# Patient Record
Sex: Male | Born: 1945 | Race: White | Hispanic: No | Marital: Married | Smoking: Former smoker
Health system: Southern US, Community
[De-identification: ages and names within clinical notes are randomized; demographics above are authoritative.]

## PROBLEM LIST (undated history)

## (undated) DIAGNOSIS — K219 Gastro-esophageal reflux disease without esophagitis: Secondary | ICD-10-CM

## (undated) DIAGNOSIS — E785 Hyperlipidemia, unspecified: Secondary | ICD-10-CM

## (undated) DIAGNOSIS — I10 Essential (primary) hypertension: Secondary | ICD-10-CM

## (undated) DIAGNOSIS — M1990 Unspecified osteoarthritis, unspecified site: Secondary | ICD-10-CM

## (undated) DIAGNOSIS — K5792 Diverticulitis of intestine, part unspecified, without perforation or abscess without bleeding: Secondary | ICD-10-CM

## (undated) HISTORY — DX: Hyperlipidemia, unspecified: E78.5

## (undated) HISTORY — DX: Essential (primary) hypertension: I10

## (undated) HISTORY — DX: Diverticulitis of intestine, part unspecified, without perforation or abscess without bleeding: K57.92

## (undated) HISTORY — DX: Unspecified osteoarthritis, unspecified site: M19.90

## (undated) HISTORY — DX: Gastro-esophageal reflux disease without esophagitis: K21.9

---

## 1994-02-28 HISTORY — PX: ROTATOR CUFF REPAIR: SHX139

## 2008-02-29 HISTORY — PX: KNEE ARTHROSCOPY W/ MENISCAL REPAIR: SHX1877

## 2013-02-28 HISTORY — PX: PARATHYROIDECTOMY: SHX19

## 2014-02-28 HISTORY — PX: HERNIA REPAIR: SHX51

## 2014-02-28 HISTORY — PX: NISSEN FUNDOPLICATION: SHX2091

## 2015-04-14 ENCOUNTER — Ambulatory Visit (INDEPENDENT_AMBULATORY_CARE_PROVIDER_SITE_OTHER): Payer: 59 | Admitting: Family Medicine

## 2015-04-14 ENCOUNTER — Encounter: Payer: Self-pay | Admitting: Family Medicine

## 2015-04-14 VITALS — BP 144/88 | HR 66 | Temp 98.1°F | Ht 71.0 in | Wt 200.4 lb

## 2015-04-14 DIAGNOSIS — I10 Essential (primary) hypertension: Secondary | ICD-10-CM | POA: Diagnosis not present

## 2015-04-14 DIAGNOSIS — K219 Gastro-esophageal reflux disease without esophagitis: Secondary | ICD-10-CM | POA: Diagnosis not present

## 2015-04-14 DIAGNOSIS — N400 Enlarged prostate without lower urinary tract symptoms: Secondary | ICD-10-CM

## 2015-04-14 DIAGNOSIS — R030 Elevated blood-pressure reading, without diagnosis of hypertension: Secondary | ICD-10-CM | POA: Insufficient documentation

## 2015-04-14 NOTE — Progress Notes (Signed)
Patient ID: David Glover, male   DOB: 08/18/1945, 70 y.o.   MRN: [id number]  Tommi Rumps, MD Phone: [phone]  David Glover is a 70 y.o. male who presents today for new patient visit.  HYPERTENSION Disease Monitoring Home BP Monitoring intermittently checks, typically 120s over 80s Chest pain- no    Dyspnea- no Currently diet and exercise controlled Lightheadedness-  no  Edema- no  GERD: This issue is resolved since having Nissen fundoplication and hiatal hernia repair. No reflux symptoms. No abdominal pain. Not currently on any medications.  BPH: Patient notes in the past he's been advised that he has a slightly enlarged mildly nodular prostate. He notes some BPH symptoms. He gets up 2 times per night to urinate. 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 He notes when he squeezes to urinate he gets some urine in the diverticula and about an hour later he gets a little bit more out of his bladder. He has been evaluated by urologist for past and they recommended surgery, the patient did not want to proceed with this. He is not on any medications for this issue.   Active Ambulatory Problems    Diagnosis Date Noted  . Essential hypertension 04/14/2015  . BPH (benign prostatic hyperplasia) 04/14/2015  . GERD (gastroesophageal reflux disease) 04/14/2015   Resolved Ambulatory Problems    Diagnosis Date Noted  . No Resolved Ambulatory Problems   Past Medical History  Diagnosis Date  . Arthritis   . Diverticulitis   . Hypertension   . Hyperlipidemia     Family History  Problem Relation Age of Onset  . Alcoholism    . Arthritis    . Lung cancer    . Heart disease      Social History   Social History  . Marital Status: Married    Spouse Name: N/A  . Number of Children: N/A  . Years of Education: N/A   Occupational History  . Not on file.   Social History Main Topics  . Smoking status:  Former Research scientist (life sciences)  . Smokeless tobacco: Not on file  . Alcohol Use: No  . Drug Use: No  . Sexual Activity: Not on file   Other Topics Concern  . Not on file   Social History Narrative  . No narrative on file    ROS   General:  Negative for nexplained weight loss, fever Skin: Negative for new or changing mole, sore that won't heal HEENT: Negative for trouble hearing, trouble seeing, ringing in ears, mouth sores, hoarseness, change in voice, dysphagia. CV:  Negative for chest pain, dyspnea, edema, palpitations Resp: Negative for cough, dyspnea, hemoptysis GI: Negative for nausea, vomiting, diarrhea, constipation, abdominal pain, melena, hematochezia. GU: Positive for frequent urination and difficulty keeping strain, Negative for dysuria, incontinence, urinary hesitance, hematuria, vaginal or penile discharge, polyuria, sexual difficulty, lumps in testicle or breasts MSK: Negative for muscle cramps or aches, joint pain or swelling Neuro: Negative for headaches, weakness, numbness, dizziness, passing out/fainting Psych: Negative for depression, anxiety, memory problems  Objective  Physical Exam Filed Vitals:   04/14/15 1006 04/14/15 1215  BP: 144/88 144/88  Pulse: 66   Temp: 98.1 F (36.7 C)     BP Readings from Last 3 Encounters:  04/14/15 144/88   Wt Readings from Last 3 Encounters:  04/14/15 200 lb 6.4 oz (90.901 kg)    Physical Exam  Constitutional: He is well-developed,  well-nourished, and in no distress.  HENT:  Head: Normocephalic and atraumatic.  Right Ear: External ear normal.  Left Ear: External ear normal.  Mouth/Throat: Oropharynx is clear and moist. No oropharyngeal exudate.  Eyes: Conjunctivae are normal. Pupils are equal, round, and reactive to light.  Neck: Neck supple.  Cardiovascular: Normal rate, regular rhythm and normal heart sounds.   Pulmonary/Chest: Effort normal and breath sounds normal.  Abdominal: Soft. Bowel sounds are normal. He exhibits no  distension. There is no tenderness. There is no rebound and no guarding.  Genitourinary: Rectum normal and penis normal.  Normal scrotum, normal testicles, normal epididymis, normal vas deferens, prostate mildly enlarged with no nodules or tenderness  Musculoskeletal: He exhibits no edema.  Lymphadenopathy:    He has no cervical adenopathy.  Neurological: He is alert. Gait normal.  Skin: Skin is warm and dry. He is not diaphoretic.  Psychiatric: Mood normal.     Assessment/Plan:   Essential hypertension Blood pressure slightly above goal today. Patient reports on checks at home it is normal. He is asymptomatic. Discussed having him check it more consistently at home and letting us know if it is consistently 140/90 or above. Patient voiced his preference for not being on medications for this. We will continue to monitor. Reports he had lab work done in the last 6 months and will bring this in for [place] to review.  BPH (benign prostatic hyperplasia) Stable. IPSS score of 16 with mostly satisfied. Discussed possible medications for this, though he declines medications stating that this is not that bothersome for him. He will continue to monitor.  GERD (gastroesophageal reflux disease) Asymptomatic since recent surgeries. We'll continue to monitor for recurrence.    Tommi Rumps

## 2015-04-14 NOTE — Progress Notes (Signed)
Pre visit review using our clinic review tool, if applicable. No additional management support is needed unless otherwise documented below in the visit note. 

## 2015-04-14 NOTE — Assessment & Plan Note (Addendum)
Blood pressure slightly above goal today. Patient reports on checks at home it is normal. He is asymptomatic. Discussed having him check it more consistently at home and letting us know if it is consistently 140/90 or above. Patient voiced his preference for not being on medications for this. We will continue to monitor. Reports he had lab work done in the last 6 months and will bring this in for [place] to review.

## 2015-04-14 NOTE — Patient Instructions (Signed)
Nice to meet you. Please continue to monitor your blood pressure at home. If it starts to run greater than 140/90 please let us know. Please continue to monitor your urinary symptoms. They worsen please let us know.

## 2015-04-14 NOTE — Assessment & Plan Note (Signed)
Stable. IPSS score of 16 with mostly satisfied. Discussed possible medications for this, though he declines medications stating that this is not that bothersome for him. He will continue to monitor.

## 2015-04-14 NOTE — Assessment & Plan Note (Signed)
Asymptomatic since recent surgeries. We'll continue to monitor for recurrence.

## 2015-05-12 ENCOUNTER — Encounter: Payer: Self-pay | Admitting: Family Medicine

## 2015-05-12 ENCOUNTER — Ambulatory Visit (INDEPENDENT_AMBULATORY_CARE_PROVIDER_SITE_OTHER): Payer: 59 | Admitting: Family Medicine

## 2015-05-12 VITALS — BP 122/76 | HR 71 | Temp 97.9°F | Ht 71.0 in | Wt 203.8 lb

## 2015-05-12 DIAGNOSIS — I10 Essential (primary) hypertension: Secondary | ICD-10-CM

## 2015-05-12 NOTE — Patient Instructions (Signed)
Nice to see you. Your blood pressure is improved today. Please continued diet and exercise as previously discussed. We will see back in 3 months for lab work and evaluation.

## 2015-05-12 NOTE — Progress Notes (Signed)
Patient ID: David Glover, male   DOB: 1945/03/11, 70 y.o.   MRN: [id number]  Tommi Rumps, MD Phone: [phone]  David Glover is a 70 y.o. male who presents today for follow-up.  Elevated BP: Patient notes he has been checking it. It is been widely variable. 115-176/62-101. Notes it can vary that much within minutes of each other. He notes it has not consistently been any particular value. No chest pain, shortness of breath, or edema. They're eating more at home. They also joined tennis club or going to start playing tennis again.  PMH: Former smoker.   ROS see history of present illness  Objective  Physical Exam Filed Vitals:   05/12/15 0930  BP: 122/76  Pulse: 71  Temp: 97.9 F (36.6 C)    BP Readings from Last 3 Encounters:  05/12/15 122/76  04/14/15 144/88   Wt Readings from Last 3 Encounters:  05/12/15 203 lb 12.8 oz (92.443 kg)  04/14/15 200 lb 6.4 oz (90.901 kg)    Physical Exam  Constitutional: He is well-developed, well-nourished, and in no distress.  HENT:  Head: Normocephalic.  Cardiovascular: Normal rate and regular rhythm.  Exam reveals no gallop and no friction rub.   No murmur heard. Pulmonary/Chest: Effort normal and breath sounds normal. No respiratory distress. He has no wheezes. He has no rales.  Neurological: He is alert. Gait normal.  Skin: Skin is warm and dry. He is not diaphoretic.     Assessment/Plan: Please see individual problem list.  Essential hypertension Blood pressure at goal today. He is asymptomatic. Sounds as though his blood pressure cuff is widely variable and he has stopped checking with this. Notes the blood pressure cuff was equally widely variable with his wife as well. Suspect blood pressure is in the normal range. He will continue to work on diet and exercise. We will continue to monitor. Follow-up in 3 months.    Tommi Rumps, MD [place]

## 2015-05-12 NOTE — Assessment & Plan Note (Signed)
Blood pressure at goal today. He is asymptomatic. Sounds as though his blood pressure cuff is widely variable and he has stopped checking with this. Notes the blood pressure cuff was equally widely variable with his wife as well. Suspect blood pressure is in the normal range. He will continue to work on diet and exercise. We will continue to monitor. Follow-up in 3 months.

## 2015-05-12 NOTE — Progress Notes (Signed)
Pre visit review using our clinic review tool, if applicable. No additional management support is needed unless otherwise documented below in the visit note. 

## 2015-08-11 ENCOUNTER — Ambulatory Visit (INDEPENDENT_AMBULATORY_CARE_PROVIDER_SITE_OTHER): Payer: 59 | Admitting: Family Medicine

## 2015-08-11 ENCOUNTER — Encounter: Payer: Self-pay | Admitting: Family Medicine

## 2015-08-11 VITALS — BP 118/76 | HR 69 | Temp 98.1°F | Ht 71.0 in | Wt 203.0 lb

## 2015-08-11 DIAGNOSIS — R0781 Pleurodynia: Secondary | ICD-10-CM | POA: Diagnosis not present

## 2015-08-11 DIAGNOSIS — R030 Elevated blood-pressure reading, without diagnosis of hypertension: Secondary | ICD-10-CM | POA: Diagnosis not present

## 2015-08-11 DIAGNOSIS — IMO0001 Reserved for inherently not codable concepts without codable children: Secondary | ICD-10-CM

## 2015-08-11 NOTE — Patient Instructions (Signed)
Nice to see you.  Your blood pressure is fantastic. Please continue to keep an eye on your ribs. If you develop chest pain, shortness of breath, or worsening rib pain please seek medical attention. I will see back in 6 months for physical.

## 2015-08-11 NOTE — Assessment & Plan Note (Signed)
Patient with a history of elevated blood pressure in the past. Has been in the normal range recently. Asymptomatic. We'll continue to monitor. He is continuing to work on diet and exercise. Continue diet and exercise.

## 2015-08-11 NOTE — Progress Notes (Signed)
Pre visit review using our clinic review tool, if applicable. No additional management support is needed unless otherwise documented below in the visit note. 

## 2015-08-11 NOTE — Assessment & Plan Note (Signed)
Patient with injury to his left ribs several months ago. Has been progressively improving. Benign exam today. Normal lung exam. Continue to monitor. If worsens he will let us know.

## 2015-08-11 NOTE — Progress Notes (Signed)
Patient ID: David Glover, male   DOB: 1945/11/01, 70 y.o.   MRN: [id number]  Tommi Rumps, MD Phone: [phone]  David Glover is a 70 y.o. male who presents today for follow-up.  Elevated BP: Patient with a history of elevated blood pressure. Is not on any medications for this. Has been normal the last several checks. No chest pain or shortness of breath. No lower extremity swelling. Has been more active with the house they are working on and with tenderness. They have been eating in more frequently than previously and he has decreased his salt intake.  Rib injury: Several months ago he notes he slipped and hit the left side of his ribs on cross bar. Had bruising. Had pain in this area. States he possibly cracked a rib. Did not get evaluated as it continued to improve and he has been breathing okay. Infrequently gets a minimal discomfort when he turns the wrong way at night and this area though otherwise feels back to normal.  PMH: Former smoker   ROS see history of present illness  Objective  Physical Exam Filed Vitals:   08/11/15 0957  BP: 118/76  Pulse: 69  Temp: 98.1 F (36.7 C)    BP Readings from Last 3 Encounters:  08/11/15 118/76  05/12/15 122/76  04/14/15 144/88   Wt Readings from Last 3 Encounters:  08/11/15 203 lb (92.08 kg)  05/12/15 203 lb 12.8 oz (92.443 kg)  04/14/15 200 lb 6.4 oz (90.901 kg)    Physical Exam  Constitutional: He is well-developed, well-nourished, and in no distress.  HENT:  Head: Normocephalic and atraumatic.  Right Ear: External ear normal.  Left Ear: External ear normal.  Cardiovascular: Normal rate, regular rhythm and normal heart sounds.   Pulmonary/Chest: Effort normal and breath sounds normal.  Musculoskeletal:  No tenderness of the left lateral ribs, no bony defects of the left lateral ribs, no flail chest  Neurological: He is alert. Gait normal.  Skin: He is not diaphoretic.     Assessment/Plan:  Please see individual problem list.  Elevated blood pressure Patient with a history of elevated blood pressure in the past. Has been in the normal range recently. Asymptomatic. We'll continue to monitor. He is continuing to work on diet and exercise. Continue diet and exercise.  Rib pain on left side Patient with injury to his left ribs several months ago. Has been progressively improving. Benign exam today. Normal lung exam. Continue to monitor. If worsens he will let us know.    Tommi Rumps, MD [place]

## 2016-02-11 ENCOUNTER — Encounter: Payer: Self-pay | Admitting: Family Medicine

## 2016-02-11 ENCOUNTER — Ambulatory Visit (INDEPENDENT_AMBULATORY_CARE_PROVIDER_SITE_OTHER): Payer: Medicare Other | Admitting: Family Medicine

## 2016-02-11 VITALS — BP 140/90 | HR 75 | Temp 97.9°F | Ht 71.5 in | Wt 210.0 lb

## 2016-02-11 DIAGNOSIS — M2550 Pain in unspecified joint: Secondary | ICD-10-CM

## 2016-02-11 DIAGNOSIS — Z13220 Encounter for screening for lipoid disorders: Secondary | ICD-10-CM

## 2016-02-11 DIAGNOSIS — Z0001 Encounter for general adult medical examination with abnormal findings: Secondary | ICD-10-CM | POA: Insufficient documentation

## 2016-02-11 DIAGNOSIS — R030 Elevated blood-pressure reading, without diagnosis of hypertension: Secondary | ICD-10-CM

## 2016-02-11 DIAGNOSIS — Z1329 Encounter for screening for other suspected endocrine disorder: Secondary | ICD-10-CM

## 2016-02-11 DIAGNOSIS — K111 Hypertrophy of salivary gland: Secondary | ICD-10-CM | POA: Diagnosis not present

## 2016-02-11 DIAGNOSIS — Z125 Encounter for screening for malignant neoplasm of prostate: Secondary | ICD-10-CM

## 2016-02-11 LAB — LIPID PANEL
Cholesterol: 222 mg/dL — ABNORMAL HIGH (ref 0–200)
HDL: 55 mg/dL (ref >39.00)
LDL Cholesterol: 144 mg/dL — ABNORMAL HIGH (ref 0–99)
NonHDL: 166.52
Total CHOL/HDL Ratio: 4
Triglycerides: 112 mg/dL (ref 0.0–149.0)
VLDL: 22.4 mg/dL (ref 0.0–40.0)

## 2016-02-11 LAB — COMPREHENSIVE METABOLIC PANEL
ALT: 28 U/L (ref 0–53)
AST: 19 U/L (ref 0–37)
Albumin: 4.3 g/dL (ref 3.5–5.2)
Alkaline Phosphatase: 57 U/L (ref 39–117)
BILIRUBIN TOTAL: 1.6 mg/dL — AB (ref 0.2–1.2)
BUN: 17 mg/dL (ref 6–23)
CO2: 31 meq/L (ref 19–32)
CREATININE: 1.17 mg/dL (ref 0.40–1.50)
Calcium: 9.3 mg/dL (ref 8.4–10.5)
Chloride: 103 mEq/L (ref 96–112)
GFR: 65.81 mL/min (ref >60.00)
GLUCOSE: 96 mg/dL (ref 70–99)
Potassium: 4.9 mEq/L (ref 3.5–5.1)
SODIUM: 139 meq/L (ref 135–145)
Total Protein: 6.4 g/dL (ref 6.0–8.3)

## 2016-02-11 LAB — TSH: TSH: 1.36 u[IU]/mL (ref 0.35–4.50)

## 2016-02-11 LAB — PSA: PSA: 0.56 ng/mL (ref 0.10–4.00)

## 2016-02-11 NOTE — Assessment & Plan Note (Signed)
Suspect enlarged tissue in submental area is the submandibular salivary glands. Palpation is not consistent with lymph node tissue. Discussed evaluation with ENT for this issue versus imaging. We opted for ENT evaluation. Referral has been placed.

## 2016-02-11 NOTE — Progress Notes (Signed)
Pre visit review using our clinic review tool, if applicable. No additional management support is needed unless otherwise documented below in the visit note. 

## 2016-02-11 NOTE — Assessment & Plan Note (Signed)
Advised on diet and exercise. Health maintenance brought up-to-date. Encouraged him to see a dentist. Lab work as outlined below. See other problems for discussion of individual issues.

## 2016-02-11 NOTE — Patient Instructions (Signed)
Nice to see you. Please continue to monitor diet. Please stay physically active. We will get you to see ear nose and throat for the swollen glands.  We'll check some lab work and call you with the results as well.

## 2016-02-11 NOTE — Assessment & Plan Note (Signed)
Patient's blood pressure elevated on first reading. Near acceptable range on second reading. Patient will not start on medicine for this. I previously discussed this with him and he would prefer not to be on medication for this. I discussed diet and exercise as a method to lower his blood pressure. He'll continue to work on this.

## 2016-02-11 NOTE — Assessment & Plan Note (Signed)
Patient notes scattered joint aches that feel similar to when he had hyperparathyroidism. We will at his request check a calcium today. I did discuss obtaining a PTH to evaluate for elevated parathyroid hormone though he declined this. Once this returns we'll determine the next step in management.

## 2016-02-11 NOTE — Progress Notes (Signed)
Marikay Alar, MD Phone: [phone]  David Glover is a 70 y.o. male who presents today for physical exam.  Diet is described as improving. Not as much eating out. No specific exercise though he is renovating a house. Reports tetanus vaccination 3 years ago. Flu shot up-to-date. Reports having received 2 pneumonia vaccines. Also Zostavax 2 years ago. He notes he is due for colonoscopy in 2 years. No recent PSA testing though we did do a prostate exam at his first visit with me earlier this year. Former smoker. Smoked 5 packs per day for 15 years though quit in the early 80s. Also formerly drank quite a bit of alcohol though quit in 1978. Also has not used illicit drugs since prior to the 70s. Has not seen a dentist in a year and a half. Has not seen ophthalmology in 2 years.  Elevated blood pressure: Patient's blood pressure elevated today. Has been elevated previously though on last 2 office visits was well-controlled. Patient is hesitant to start any medications. No chest pain, shortness breath, or edema.  Patient notes scattered joint aches. Particularly when he stands. He has a history of hyperparathyroidism and had a single parathyroidectomy in 2015. Notes these joint aches feel similar to the ones he had previously. He would like his calcium checked today to follow-up on his parathyroid issues.  Patient additionally notes having had some swollen glands in the submental area for the last 6-8 months. Notes he all of a sudden noted that they were enlarged. Haven't specifically changed since then. Nontender. They're just there and larger than he previously noted.  Active Ambulatory Problems    Diagnosis Date Noted  . Elevated blood pressure reading 04/14/2015  . BPH (benign prostatic hyperplasia) 04/14/2015  . GERD (gastroesophageal reflux disease) 04/14/2015  . Rib pain on left side 08/11/2015  . Encounter for general adult medical examination with abnormal findings  02/11/2016  . Joint pain 02/11/2016  . Enlarged salivary gland 02/11/2016   Resolved Ambulatory Problems    Diagnosis Date Noted  . No Resolved Ambulatory Problems   Past Medical History:  Diagnosis Date  . Arthritis   . Diverticulitis   . GERD (gastroesophageal reflux disease)   . Hyperlipidemia   . Hypertension     Family History  Problem Relation Age of Onset  . Alcoholism    . Arthritis    . Lung cancer    . Heart disease      Social History   Social History  . Marital status: Married    Spouse name: N/A  . Number of children: N/A  . Years of education: N/A   Occupational History  . Not on file.   Social History Main Topics  . Smoking status: Former Games developer  . Smokeless tobacco: Not on file  . Alcohol use No  . Drug use: No  . Sexual activity: Not on file   Other Topics Concern  . Not on file   Social History Narrative  . No narrative on file    ROS  General:  Negative for nexplained weight loss, fever Skin: Negative for new or changing mole, sore that won't heal HEENT: Negative for trouble hearing, trouble seeing, ringing in ears, mouth sores, hoarseness, change in voice, dysphagia. CV:  Negative for chest pain, dyspnea, edema, palpitations Resp: Negative for cough, dyspnea, hemoptysis GI: Negative for nausea, vomiting, diarrhea, constipation, abdominal pain, melena, hematochezia. GU: Negative for dysuria, incontinence, urinary hesitance, hematuria, vaginal or penile discharge, polyuria, sexual difficulty, lumps in  testicle or breasts MSK: Negative for muscle cramps or aches,Positive for joint pain or swelling Neuro: Negative for headaches, weakness, numbness, dizziness, passing out/fainting Psych: Negative for depression, anxiety, memory problems  Objective  Physical Exam Vitals:   02/11/16 1042 02/11/16 1136  BP: (!) 162/98 140/90  Pulse: 75   Temp: 97.9 F (36.6 C)     BP Readings from Last 3 Encounters:  02/11/16 140/90  08/11/15  118/76  05/12/15 122/76   Wt Readings from Last 3 Encounters:  02/11/16 210 lb (95.3 kg)  08/11/15 203 lb (92.1 kg)  05/12/15 203 lb 12.8 oz (92.4 kg)    Physical Exam  Constitutional: No distress.  HENT:  Head: Normocephalic and atraumatic.  Mouth/Throat: Oropharynx is clear and moist. No oropharyngeal exudate.  There appear to be some enlarged nontender glands in the submental area, no warmth in this area  Eyes: Conjunctivae are normal. Pupils are equal, round, and reactive to light.  Neck: Neck supple.  Cardiovascular: Normal rate, regular rhythm and normal heart sounds.   Pulmonary/Chest: Effort normal and breath sounds normal.  Abdominal: Soft. Bowel sounds are normal. He exhibits no distension. There is no tenderness. There is no rebound.  Musculoskeletal: He exhibits no edema.  Lymphadenopathy:    He has no cervical adenopathy.  Neurological: He is alert. Gait normal.  Skin: Skin is warm and dry. He is not diaphoretic.  Psychiatric: Mood and affect normal.     Assessment/Plan:   Encounter for general adult medical examination with abnormal findings Advised on diet and exercise. Health maintenance brought up-to-date. Encouraged him to see a dentist. Lab work as outlined below. See other problems for discussion of individual issues.  Joint pain Patient notes scattered joint aches that feel similar to when he had hyperparathyroidism. We will at his request check a calcium today. I did discuss obtaining a PTH to evaluate for elevated parathyroid hormone though he declined this. Once this returns we'll determine the next step in management.  Enlarged salivary gland Suspect enlarged tissue in submental area is the submandibular salivary glands. Palpation is not consistent with lymph node tissue. Discussed evaluation with ENT for this issue versus imaging. We opted for ENT evaluation. Referral has been placed.  Elevated blood pressure reading Patient's blood pressure elevated  on first reading. Near acceptable range on second reading. Patient will not start on medicine for this. I previously discussed this with him and he would prefer not to be on medication for this. I discussed diet and exercise as a method to lower his blood pressure. He'll continue to work on this.   Orders Placed This Encounter  Procedures  . Lipid Profile  . Comp Met (CMET)  . PSA  . TSH  . Ambulatory referral to ENT    Referral Priority:   Routine    Referral Type:   Consultation    Referral Reason:   Specialty Services Required    Requested Specialty:   Otolaryngology    Number of Visits Requested:   1    No orders of the defined types were placed in this encounter.    Tommi Rumps, MD [place]

## 2016-02-18 ENCOUNTER — Other Ambulatory Visit: Payer: Self-pay | Admitting: Family Medicine

## 2016-02-18 DIAGNOSIS — R17 Unspecified jaundice: Secondary | ICD-10-CM

## 2016-03-03 DIAGNOSIS — R221 Localized swelling, mass and lump, neck: Secondary | ICD-10-CM | POA: Diagnosis not present

## 2016-03-14 ENCOUNTER — Other Ambulatory Visit (INDEPENDENT_AMBULATORY_CARE_PROVIDER_SITE_OTHER): Payer: Medicare Other

## 2016-03-14 DIAGNOSIS — R17 Unspecified jaundice: Secondary | ICD-10-CM | POA: Diagnosis not present

## 2016-03-14 NOTE — Addendum Note (Signed)
Addended by: Arby Barrette on: 03/14/2016 10:54 AM   Modules accepted: Orders

## 2016-03-21 LAB — BILIRUBIN, FRACTIONATED(TOT/DIR/INDIR)

## 2016-03-22 ENCOUNTER — Other Ambulatory Visit (INDEPENDENT_AMBULATORY_CARE_PROVIDER_SITE_OTHER): Payer: Medicare Other

## 2016-03-22 DIAGNOSIS — R17 Unspecified jaundice: Secondary | ICD-10-CM | POA: Diagnosis not present

## 2016-03-23 ENCOUNTER — Ambulatory Visit (INDEPENDENT_AMBULATORY_CARE_PROVIDER_SITE_OTHER): Payer: Medicare Other

## 2016-03-23 ENCOUNTER — Encounter: Payer: Self-pay | Admitting: Family Medicine

## 2016-03-23 ENCOUNTER — Ambulatory Visit (INDEPENDENT_AMBULATORY_CARE_PROVIDER_SITE_OTHER): Payer: Medicare Other | Admitting: Family Medicine

## 2016-03-23 ENCOUNTER — Telehealth: Payer: Self-pay | Admitting: Family Medicine

## 2016-03-23 VITALS — BP 150/98 | HR 75 | Temp 97.8°F | Wt 212.4 lb

## 2016-03-23 DIAGNOSIS — M2550 Pain in unspecified joint: Secondary | ICD-10-CM

## 2016-03-23 DIAGNOSIS — M25531 Pain in right wrist: Secondary | ICD-10-CM

## 2016-03-23 DIAGNOSIS — M19041 Primary osteoarthritis, right hand: Secondary | ICD-10-CM | POA: Diagnosis not present

## 2016-03-23 DIAGNOSIS — M79641 Pain in right hand: Secondary | ICD-10-CM

## 2016-03-23 DIAGNOSIS — M19031 Primary osteoarthritis, right wrist: Secondary | ICD-10-CM | POA: Diagnosis not present

## 2016-03-23 DIAGNOSIS — R030 Elevated blood-pressure reading, without diagnosis of hypertension: Secondary | ICD-10-CM | POA: Diagnosis not present

## 2016-03-23 LAB — BILIRUBIN, FRACTIONATED(TOT/DIR/INDIR)
BILIRUBIN DIRECT: 0.2 mg/dL (ref <=0.2)
BILIRUBIN INDIRECT: 1.1 mg/dL (ref 0.2–1.2)
BILIRUBIN TOTAL: 1.3 mg/dL — AB (ref 0.2–1.2)

## 2016-03-23 NOTE — Progress Notes (Signed)
Pre visit review using our clinic review tool, if applicable. No additional management support is needed unless otherwise documented below in the visit note. 

## 2016-03-23 NOTE — Assessment & Plan Note (Signed)
Continue to have issues with hip pain. Given discomfort with internal range of motion and slight decreased internal range of motion suspect osteoarthritis. Discussed that we could do x-rays to confirm this though it would likely not change management. He needs to stay active and exercises hips.

## 2016-03-23 NOTE — Progress Notes (Signed)
David Rumps, MD Phone: [phone]  Briton Tiffin lll is a 71 y.o. male who presents today for same-day visit.  Patient notes right wrist and hand pain over the last week or so. Notes it is in his palm and volar aspect of his wrist. Started after he was tearing up flooring with a bar. He was pushing on the bar and it dropped as ag it went o through the nail. Feels as though there is bruising. He does note 6-7 months ago he felt as though he hurt his right middle finger as well. Was using the same bar at that time for another project. He did report a rib injury at that time and was evaluated for the rib injury. Finger pain went away at that time though this started bothering him again about a week ago. He noted issues bending it initially though since it started bothering him again he is bending it without any difficulty. Notes his hands do feel it arthritic.  He reports he has continued to have bilateral hip discomfort at times. He had that previously when he had hyperparathyroidism issues though his most recent calcium was normal. Tries to stay active though has not been playing as much tennis.  Patient reports blood pressures are typically in the 120s over 80s at home. He does not want any treatment for his blood pressure.  PMH: Former smoker  ROS see history of present illness  Objective  Physical Exam Vitals:   03/23/16 1023  BP: (!) 150/98  Pulse: 75  Temp: 97.8 F (36.6 C)    BP Readings from Last 3 Encounters:  03/23/16 (!) 150/98  02/11/16 140/90  08/11/15 118/76   Wt Readings from Last 3 Encounters:  03/23/16 212 lb 6.4 oz (96.3 kg)  02/11/16 210 lb (95.3 kg)  08/11/15 203 lb (92.1 kg)    Physical Exam  Constitutional: He is well-developed, well-nourished, and in no distress.  Cardiovascular:  Capillary refill less than 2 seconds bilateral hands  Pulmonary/Chest: Effort normal.  Musculoskeletal: He exhibits no edema.  Right hand with mild tenderness  over the palmar aspect, no swelling or erythema in this area, no swelling or erythema of the right middle finger, no tenderness of the right middle finger, there are 2 small cuts with one on each side of the proximal right middle finger, no other joint tenderness, no anatomic snuffbox tenderness in the right hand, left hand with no tenderness, swelling, erythema, or anatomic snuffbox tenderness, bilateral hips with slight decreased internal range of motion with discomfort on internal range of motion, full external range of motion  Neurological: He is alert. Gait normal.  Sensation to light touch intact bilateral hands     Assessment/Plan: Please see individual problem list.  Elevated blood pressure reading Reports it has been normal at home. Suspect related to white coat hypertension. He'll continue to monitor. He continues to decline any medication.  Joint pain Continue to have issues with hip pain. Given discomfort with internal range of motion and slight decreased internal range of motion suspect osteoarthritis. Discussed that we could do x-rays to confirm this though it would likely not change management. He needs to stay active and exercises hips.  Right hand pain I suspect soft tissue injury to hand and finger. He has no tenderness in the finger. No bony defects noted. He does have some slight tenderness of the soft tissues in his hands though no bony defects noted as well. We will obtain x-ray of his right hand and  right wrist to evaluate for fracture. If no fracture he can ice and take anti-inflammatories.   Orders Placed This Encounter  Procedures  . DG Hand Complete Right    Standing Status:   Future    Number of Occurrences:   1    Standing Expiration Date:   05/21/2017    Order Specific Question:   Reason for Exam (SYMPTOM  OR DIAGNOSIS REQUIRED)    Answer:   right palm pain after pushing on bar while tearing up flooring    Order Specific Question:   Preferred imaging location?      Answer:   [facility]  . DG Wrist Complete Right    Standing Status:   Future    Number of Occurrences:   1    Standing Expiration Date:   05/21/2017    Order Specific Question:   Reason for Exam (SYMPTOM  OR DIAGNOSIS REQUIRED)    Answer:   right palm/wrist pain after pushing on bar while tearing up flooring    Order Specific Question:   Preferred imaging location?    Answer:   [facility]   David Rumps, MD [place]

## 2016-03-23 NOTE — Assessment & Plan Note (Signed)
I suspect soft tissue injury to hand and finger. He has no tenderness in the finger. No bony defects noted. He does have some slight tenderness of the soft tissues in his hands though no bony defects noted as well. We will obtain x-ray of his right hand and right wrist to evaluate for fracture. If no fracture he can ice and take anti-inflammatories.

## 2016-03-23 NOTE — Patient Instructions (Signed)
Nice to see you. We will obtain x-rays of your hand and wrist on the right side. We will contact you once these return to let you know what the next step in management of this.

## 2016-03-23 NOTE — Assessment & Plan Note (Signed)
Reports it has been normal at home. Suspect related to white coat hypertension. He'll continue to monitor. He continues to decline any medication.

## 2016-03-23 NOTE — Telephone Encounter (Signed)
Pt called and left vm stating that he was returning your call. Thank you!  Call pt @ [phone]

## 2016-03-23 NOTE — Telephone Encounter (Signed)
See other message

## 2016-03-24 ENCOUNTER — Telehealth: Payer: Self-pay | Admitting: Family Medicine

## 2016-03-24 DIAGNOSIS — R17 Unspecified jaundice: Secondary | ICD-10-CM

## 2016-03-24 NOTE — Telephone Encounter (Signed)
Patient has already been redrawn

## 2016-03-24 NOTE — Telephone Encounter (Signed)
Linda from [place] called and stated they called at 3:27 and faxed over a form in regards to order from 1/15 for total bili. Please advise, thank you!  [phone] opt 2   Req # [id number]

## 2016-03-25 NOTE — Progress Notes (Signed)
Late entry. Pt was called & came back in for repeat testing after labs were lost by [facility] lab. Repeat labs have already resulted & pt is aware.

## 2016-04-05 NOTE — Telephone Encounter (Signed)
Patient wanted a phone call from Engineer, building services. I left a message to inform him that she was out of the office ,but will return his call on tomorrow.

## 2016-04-07 NOTE — Telephone Encounter (Signed)
I spoke with the patient he stated that he is suspicious that the labs that were drawn were not resulted correctly. He stated that he was here after 4:00 and he saw the courier come to the office and leave and he still having his labs drawn. I informed him that another courier comes after 5:00 to pick up labs and they do not sit over night. He stated that he did not trust the company that lost his labs and he would like his labs sent to [facility] and he wants his Bilirubin redrawn at [facility]. I informed the patient that I would speak with his physician and our Engineer, building services and give him a return call.

## 2016-04-08 NOTE — Telephone Encounter (Signed)
Patient requested repeat bilirubin to be done at [facility].

## 2016-04-11 NOTE — Telephone Encounter (Signed)
Order placed. He can come to our office or go to any [facility] station to get this done. Thanks.

## 2016-04-12 ENCOUNTER — Other Ambulatory Visit: Payer: Self-pay | Admitting: Family Medicine

## 2016-04-12 ENCOUNTER — Telehealth: Payer: Self-pay | Admitting: *Deleted

## 2016-04-12 DIAGNOSIS — R17 Unspecified jaundice: Secondary | ICD-10-CM

## 2016-04-12 NOTE — Telephone Encounter (Signed)
Orders placed for pt to go to [place]

## 2016-04-20 DIAGNOSIS — R17 Unspecified jaundice: Secondary | ICD-10-CM | POA: Diagnosis not present

## 2016-04-21 LAB — BILIRUBIN, FRACTIONATED(TOT/DIR/INDIR)
BILIRUBIN INDIRECT: 1.06 — AB (ref 0.10–0.80)
BILIRUBIN TOTAL: 1.3 mg/dL — AB (ref 0.0–1.2)
Bilirubin, Direct: 0.24 mg/dL (ref 0.00–0.40)

## 2016-04-22 ENCOUNTER — Other Ambulatory Visit: Payer: Self-pay | Admitting: Family Medicine

## 2016-05-02 ENCOUNTER — Other Ambulatory Visit: Payer: Medicare Other

## 2016-05-03 ENCOUNTER — Other Ambulatory Visit (INDEPENDENT_AMBULATORY_CARE_PROVIDER_SITE_OTHER): Payer: Medicare Other

## 2016-05-03 NOTE — Addendum Note (Signed)
Addended by: Leeanne Rio on: 05/03/2016 11:10 AM   Modules accepted: Orders

## 2016-05-03 NOTE — Addendum Note (Signed)
Addended by: Leeanne Rio on: 05/03/2016 11:09 AM   Modules accepted: Orders

## 2016-05-04 LAB — CBC
Hematocrit: 41.8 % (ref 37.5–51.0)
Hemoglobin: 15 g/dL (ref 13.0–17.7)
MCH: 31.4 pg (ref 26.6–33.0)
MCHC: 35.9 g/dL — AB (ref 31.5–35.7)
MCV: 87 fL (ref 79–97)
Platelets: 200 10*3/uL (ref 150–379)
RBC: 4.78 x10E6/uL (ref 4.14–5.80)
RDW: 13.7 % (ref 12.3–15.4)
WBC: 5.6 10*3/uL (ref 3.4–10.8)

## 2016-05-04 LAB — HAPTOGLOBIN: HAPTOGLOBIN: 99 mg/dL (ref 34–200)

## 2016-05-04 LAB — RETICULOCYTES: RETIC CT PCT: 1.1 % (ref 0.6–2.6)

## 2016-05-15 ENCOUNTER — Encounter: Payer: Self-pay | Admitting: Family Medicine

## 2016-05-30 ENCOUNTER — Telehealth: Payer: Self-pay | Admitting: Family Medicine

## 2016-05-30 NOTE — Telephone Encounter (Signed)
Pt declined AWV. °

## 2016-06-01 DIAGNOSIS — S60552A Superficial foreign body of left hand, initial encounter: Secondary | ICD-10-CM | POA: Diagnosis not present

## 2016-06-01 DIAGNOSIS — M19042 Primary osteoarthritis, left hand: Secondary | ICD-10-CM | POA: Diagnosis not present

## 2016-10-20 ENCOUNTER — Emergency Department: Payer: Medicare Other

## 2016-10-20 ENCOUNTER — Emergency Department
Admission: EM | Admit: 2016-10-20 | Discharge: 2016-10-20 | Disposition: A | Discharge status: Home / Self Care | Payer: Medicare Other | Attending: Emergency Medicine | Admitting: Emergency Medicine

## 2016-10-20 ENCOUNTER — Encounter: Payer: Self-pay | Admitting: Emergency Medicine

## 2016-10-20 DIAGNOSIS — Z87891 Personal history of nicotine dependence: Secondary | ICD-10-CM | POA: Insufficient documentation

## 2016-10-20 DIAGNOSIS — D3500 Benign neoplasm of unspecified adrenal gland: Secondary | ICD-10-CM | POA: Diagnosis not present

## 2016-10-20 DIAGNOSIS — R1909 Other intra-abdominal and pelvic swelling, mass and lump: Secondary | ICD-10-CM | POA: Diagnosis not present

## 2016-10-20 DIAGNOSIS — N12 Tubulo-interstitial nephritis, not specified as acute or chronic: Secondary | ICD-10-CM

## 2016-10-20 DIAGNOSIS — N10 Acute tubulo-interstitial nephritis: Secondary | ICD-10-CM | POA: Diagnosis not present

## 2016-10-20 DIAGNOSIS — R7401 Elevation of levels of liver transaminase levels: Secondary | ICD-10-CM

## 2016-10-20 DIAGNOSIS — I10 Essential (primary) hypertension: Secondary | ICD-10-CM | POA: Insufficient documentation

## 2016-10-20 DIAGNOSIS — R109 Unspecified abdominal pain: Secondary | ICD-10-CM | POA: Diagnosis present

## 2016-10-20 DIAGNOSIS — R740 Nonspecific elevation of levels of transaminase and lactic acid dehydrogenase [LDH]: Secondary | ICD-10-CM | POA: Insufficient documentation

## 2016-10-20 DIAGNOSIS — R339 Retention of urine, unspecified: Secondary | ICD-10-CM | POA: Diagnosis not present

## 2016-10-20 LAB — COMPREHENSIVE METABOLIC PANEL
ALBUMIN: 4.3 g/dL (ref 3.5–5.0)
ALT: 87 U/L — AB (ref 17–63)
AST: 75 U/L — ABNORMAL HIGH (ref 15–41)
Alkaline Phosphatase: 89 U/L (ref 38–126)
Anion gap: 9 (ref 5–15)
BUN: 16 mg/dL (ref 6–20)
CHLORIDE: 102 mmol/L (ref 101–111)
CO2: 25 mmol/L (ref 22–32)
CREATININE: 1.12 mg/dL (ref 0.61–1.24)
Calcium: 9.2 mg/dL (ref 8.9–10.3)
GFR calc non Af Amer: >60 mL/min (ref >60)
Glucose, Bld: 95 mg/dL (ref 65–99)
Potassium: 4.1 mmol/L (ref 3.5–5.1)
SODIUM: 136 mmol/L (ref 135–145)
Total Bilirubin: 2.6 mg/dL — ABNORMAL HIGH (ref 0.3–1.2)
Total Protein: 7.5 g/dL (ref 6.5–8.1)

## 2016-10-20 LAB — URINALYSIS, COMPLETE (UACMP) WITH MICROSCOPIC
Bilirubin Urine: NEGATIVE
Glucose, UA: NEGATIVE mg/dL
Hgb urine dipstick: NEGATIVE
KETONES UR: 5 mg/dL — AB
Nitrite: NEGATIVE
PROTEIN: NEGATIVE mg/dL
SQUAMOUS EPITHELIAL / LPF: NONE SEEN
Specific Gravity, Urine: 1.012 (ref 1.005–1.030)
pH: 5 (ref 5.0–8.0)

## 2016-10-20 LAB — LIPASE, BLOOD: LIPASE: 35 U/L (ref 11–51)

## 2016-10-20 LAB — CBC
HCT: 44.7 % (ref 40.0–52.0)
HEMOGLOBIN: 15.3 g/dL (ref 13.0–18.0)
MCH: 31.5 pg (ref 26.0–34.0)
MCHC: 34.3 g/dL (ref 32.0–36.0)
MCV: 91.7 fL (ref 80.0–100.0)
PLATELETS: 191 10*3/uL (ref 150–440)
RBC: 4.87 MIL/uL (ref 4.40–5.90)
RDW: 13 % (ref 11.5–14.5)
WBC: 15.1 10*3/uL — ABNORMAL HIGH (ref 3.8–10.6)

## 2016-10-20 MED ORDER — LIDOCAINE HCL 2 % EX GEL
CUTANEOUS | Status: AC
  Administered 2016-10-20: 1 via URETHRAL
  Filled 2016-10-20: qty 10

## 2016-10-20 MED ORDER — LIDOCAINE HCL 2 % EX GEL
1.0000 "application " | Freq: Once | CUTANEOUS | Status: AC
  Administered 2016-10-20: 1 via URETHRAL

## 2016-10-20 MED ORDER — CEFTRIAXONE SODIUM 1 G IJ SOLR
1.0000 g | Freq: Once | INTRAMUSCULAR | Status: AC
  Administered 2016-10-20: 1 g via INTRAVENOUS
  Filled 2016-10-20: qty 10

## 2016-10-20 MED ORDER — TAMSULOSIN HCL 0.4 MG PO CAPS: 0.4000 mg | ORAL_CAPSULE | Freq: Every day | ORAL | 0 refills | Status: AC

## 2016-10-20 MED ORDER — CEPHALEXIN 500 MG PO CAPS: 500.0000 mg | ORAL_CAPSULE | Freq: Three times a day (TID) | ORAL | 0 refills | Status: DC

## 2016-10-20 MED ORDER — LIDOCAINE HCL 2 % EX GEL: 1.0000 "application " | Freq: Once | CUTANEOUS | Status: DC

## 2016-10-20 NOTE — ED Notes (Signed)
Pt taken to CT at this time.

## 2016-10-20 NOTE — ED Notes (Signed)
Post void residual showed approx 494cc's of urine in patient's bladder at this time.

## 2016-10-20 NOTE — ED Notes (Signed)
Foley catheter placed prior to discharge.  Pt provided with proper supplies as well as home care instructions of leg bag and standard drainage bag and how to properly switch the two.  Pt and spouse express verbal understanding w/ no further questions at this time.

## 2016-10-20 NOTE — ED Triage Notes (Signed)
Pt brought over from [facility]. Pt reports flank pain last week that has moved to RLQ and LLQ. Pt reports a feeling of pressure in his bladder with a burning sensation when urinating and difficulty initiating urine stream. Denies NVD.

## 2016-10-20 NOTE — ED Provider Notes (Addendum)
[facility] Emergency Department Provider Note  ____________________________________________  Time seen: Approximately 2:20 PM  I have reviewed the triage vital signs and the nursing notes.   HISTORY  Chief Complaint Abdominal Pain and Dysuria   HPI David Glover is a 71 y.o. male ith a history of GERD, BPH who presents for evaluation of dysuria and frequency. patient reports one week of progressively worsening dysuria and frequency. Patient reports that last night he woke up every hour to urinate. This morning he felt hot to the touch and has had some chills and sweats. No nausea or vomiting. Patient denies abdominal pain in spite of what triage note says. He does endorse mild throbbing/ dull discomfort located in his bilateral flanks since yesterday. No prior h/o UTI.   Past Medical History:  Diagnosis Date  . Arthritis   . Diverticulitis   . GERD (gastroesophageal reflux disease)   . Hyperlipidemia   . Hypertension     Patient Active Problem List   Diagnosis Date Noted  . Elevated bilirubin 04/12/2016  . Right hand pain 03/23/2016  . Encounter for general adult medical examination with abnormal findings 02/11/2016  . Joint pain 02/11/2016  . Enlarged salivary gland 02/11/2016  . Rib pain on left side 08/11/2015  . Elevated blood pressure reading 04/14/2015  . BPH (benign prostatic hyperplasia) 04/14/2015  . GERD (gastroesophageal reflux disease) 04/14/2015    Past Surgical History:  Procedure Laterality Date  . HERNIA REPAIR  2016   Bilateral inguinal, femoral  . KNEE ARTHROSCOPY W/ MENISCAL REPAIR  2010  . NISSEN FUNDOPLICATION  2979  . PARATHYROIDECTOMY  2015  . [place]    Prior to Admission medications   Medication Sig Start Date End Date Taking? Authorizing Provider  cephALEXin (KEFLEX) 500 MG capsule Take 1 capsule (500 mg total) by mouth 3 (three) times daily. 10/20/16 11/03/16  Rudene Re, MD    tamsulosin (FLOMAX) 0.4 MG CAPS capsule Take 1 capsule (0.4 mg total) by mouth daily. 10/20/16   Rudene Re, MD    Allergies Penicillins  Family History  Problem Relation Age of Onset  . Alcoholism Unknown   . Arthritis Unknown   . Lung cancer Unknown   . Heart disease Unknown     Social History Social History  Substance Use Topics  . Smoking status: Former Research scientist (life sciences)  . Smokeless tobacco: Never Used  . Alcohol use No    Review of Systems  Constitutional: + subjective fever, chills. Eyes: Negative for visual changes. ENT: Negative for sore throat. Neck: No neck pain  Cardiovascular: Negative for chest pain. Respiratory: Negative for shortness of breath. Gastrointestinal: Negative for abdominal pain, vomiting or diarrhea. Genitourinary: + dysuria, frequency, bilateral flank pain Musculoskeletal: Negative for back pain. Skin: Negative for rash. Neurological: Negative for headaches, weakness or numbness. Psych: No SI or HI  ____________________________________________   PHYSICAL EXAM:  VITAL SIGNS: ED Triage Vitals  Enc Vitals Group     BP 10/20/16 1146 (!) 111/95     Pulse Rate 10/20/16 1146 92     Resp 10/20/16 1146 18     Temp 10/20/16 1146 97.8 F (36.6 C)     Temp Source 10/20/16 1146 Oral     SpO2 10/20/16 1146 96 %     Weight 10/20/16 1146 204 lb (92.5 kg)     Height 10/20/16 1146 6' (1.829 m)     Head Circumference --      Peak Flow --  Pain Score 10/20/16 1145 2     Pain Loc --      Pain Edu? --      Excl. in [place]? --     Constitutional: Alert and oriented. Well appearing and in no apparent distress. HEENT:      Head: Normocephalic and atraumatic.         Eyes: Conjunctivae are normal. Sclera is non-icteric.       Mouth/Throat: Mucous membranes are moist.       Neck: Supple with no signs of meningismus. Cardiovascular: Regular rate and rhythm. No murmurs, gallops, or rubs. 2+ symmetrical distal pulses are present in all extremities. No  JVD. Respiratory: Normal respiratory effort. Lungs are clear to auscultation bilaterally. No wheezes, crackles, or rhonchi.  Gastrointestinal: Soft, non tender, and non distended with positive bowel sounds. No rebound or guarding. Genitourinary: No CVA tenderness. Musculoskeletal: Nontender with normal range of motion in all extremities. No edema, cyanosis, or erythema of extremities. Neurologic: Normal speech and language. Face is symmetric. Moving all extremities. No gross focal neurologic deficits are appreciated. Skin: Skin is warm, dry and intact. No rash noted. Psychiatric: Mood and affect are normal. Speech and behavior are normal.  ____________________________________________   LABS (all labs ordered are listed, but only abnormal results are displayed)  Labs Reviewed  COMPREHENSIVE METABOLIC PANEL - Abnormal; Notable for the following:       Result Value   AST 75 (*)    ALT 87 (*)    Total Bilirubin 2.6 (*)    All other components within normal limits  CBC - Abnormal; Notable for the following:    WBC 15.1 (*)    All other components within normal limits  URINALYSIS, COMPLETE (UACMP) WITH MICROSCOPIC - Abnormal; Notable for the following:    Color, Urine YELLOW (*)    APPearance HAZY (*)    Ketones, ur 5 (*)    Leukocytes, UA MODERATE (*)    Bacteria, UA FEW (*)    All other components within normal limits  URINE CULTURE  LIPASE, BLOOD   ____________________________________________  EKG  none  ____________________________________________  RADIOLOGY  CT renal: 1. No acute intra- abdominal process to explain the patient's pain. No renal or ureteral calculi. No hydronephrosis. 2. Two well-defined oval fluid density lesions in the left pelvic sidewall appear separate from the bladder and likely represent lymphoceles, less likely bladder diverticula. 3. Aortic atherosclerosis (ICD10-I70.0). 4. Bilateral adrenal adenomas.    ____________________________________________   PROCEDURES  Procedure(s) performed: None Procedures Critical Care performed:  None ____________________________________________   INITIAL IMPRESSION / ASSESSMENT AND PLAN / ED COURSE  71 y.o. male ith a history of GERD, BPH who presents for evaluation of dysuria and frequency x 1 week. Patient complaining of mild flank pain and subjective fever. He is extremely well-appearing with normal vital signs here. No fever no tachycardia. His abdomen is completely soft with no tenderness throughout. There is no flank tenderness. Labs showing leukocytosis with WBC 15.1 and UA positive for UTI concerning for early pyelonephritis. Patient given rocephin and will be dc home on klefex per ID recommendations. Urine culture is pending. PVR of 494 cc. Discussed with Dr. Pilar Jarvis who recommended Foley placement. Discussed foley care with patient and close f/u with Urology.    Patient with slightly elevated Tbili and LFTs which he has been evaluated by his PCP with unclear etiology at this time. He has no RUQ ttp and CT a/p shows no abnormalities of liver and gallbladder. Recommended f/u  with PCP for further management and evaluation of this issue. Discuss return precautions for signs of worsening infection including fever, N/V, abdominal pain, worse flank pain and recommended the patient returns if these symptoms develop. I'm referring patient to urology for close follow-up.     Pertinent labs & imaging results that were available during my care of the patient were reviewed by me and considered in my medical decision making (see chart for details).    ____________________________________________   FINAL CLINICAL IMPRESSION(S) / ED DIAGNOSES  Final diagnoses:  Pyelonephritis  Transaminitis  Urinary retention      NEW MEDICATIONS STARTED DURING THIS VISIT:  New Prescriptions   CEPHALEXIN (KEFLEX) 500 MG CAPSULE    Take 1 capsule (500 mg total) by  mouth 3 (three) times daily.   TAMSULOSIN (FLOMAX) 0.4 MG CAPS CAPSULE    Take 1 capsule (0.4 mg total) by mouth daily.     Note:  This document was prepared using Dragon voice recognition software and may include unintentional dictation errors.    Alfred Levins, [place], MD 10/20/16 [place], [place], MD 10/20/16 [phone]

## 2016-10-20 NOTE — Discharge Instructions (Addendum)
Return to the emergency room for abdominal pain, back pain, fever, nausea vomiting. Otherwise follow up with your doctor and Dr. Pilar Jarvis, Urology for further evaluation.  Catheter care  Always wash your hands before and after touching your catheter.  Check the area around the urethra for inflammation or signs of infection. Signs of infection include irritated, swollen, red, or tender skin, or pus around the catheter.  Clean the area around the catheter with soap and water two times a day. Dry with a clean towel afterward.  Do not apply powder or lotion to the skin around the catheter.  To empty the urine collection bag  Wash your hands with soap and water.  Without touching the drain spout, remove the spout from its sleeve at the bottom of the collection bag. Open the valve on the spout.  Let the urine flow out of the bag and into the toilet or a container. Do not let the tubing or drain spout touch anything.  After you empty the bag, clean the end of the drain spout with tissue and water. Close the valve and put the drain spout back into its sleeve at the bottom of the collection bag.  Wash your hands with soap and water.   As I explained to your your liver panel was abnormal but your CT was negative for any liver or gallbladder issues. Make sure to follow up with your doctor for further evaluation of these abnormalities.

## 2016-10-20 NOTE — ED Notes (Signed)
Assisted RN with foley

## 2016-10-22 LAB — URINE CULTURE: Culture: >=100000 — AB

## 2016-10-23 ENCOUNTER — Emergency Department
Admission: EM | Admit: 2016-10-23 | Discharge: 2016-10-23 | Disposition: A | Discharge status: Home / Self Care | Payer: Medicare Other | Attending: Emergency Medicine | Admitting: Emergency Medicine

## 2016-10-23 ENCOUNTER — Encounter: Payer: Self-pay | Admitting: Emergency Medicine

## 2016-10-23 DIAGNOSIS — N3001 Acute cystitis with hematuria: Secondary | ICD-10-CM | POA: Diagnosis not present

## 2016-10-23 DIAGNOSIS — Z87891 Personal history of nicotine dependence: Secondary | ICD-10-CM | POA: Diagnosis not present

## 2016-10-23 DIAGNOSIS — I10 Essential (primary) hypertension: Secondary | ICD-10-CM | POA: Insufficient documentation

## 2016-10-23 DIAGNOSIS — R8279 Other abnormal findings on microbiological examination of urine: Secondary | ICD-10-CM | POA: Diagnosis present

## 2016-10-23 LAB — BASIC METABOLIC PANEL
Anion gap: 11 (ref 5–15)
BUN: 14 mg/dL (ref 6–20)
CHLORIDE: 102 mmol/L (ref 101–111)
CO2: 24 mmol/L (ref 22–32)
Calcium: 9 mg/dL (ref 8.9–10.3)
Creatinine, Ser: 1.02 mg/dL (ref 0.61–1.24)
GFR calc non Af Amer: >60 mL/min (ref >60)
Glucose, Bld: 95 mg/dL (ref 65–99)
POTASSIUM: 4.1 mmol/L (ref 3.5–5.1)
SODIUM: 137 mmol/L (ref 135–145)

## 2016-10-23 LAB — URINALYSIS, COMPLETE (UACMP) WITH MICROSCOPIC
BILIRUBIN URINE: NEGATIVE
GLUCOSE, UA: NEGATIVE mg/dL
KETONES UR: NEGATIVE mg/dL
NITRITE: NEGATIVE
PROTEIN: NEGATIVE mg/dL
Specific Gravity, Urine: 1.004 — ABNORMAL LOW (ref 1.005–1.030)
Squamous Epithelial / LPF: NONE SEEN
pH: 5 (ref 5.0–8.0)

## 2016-10-23 LAB — CBC WITH DIFFERENTIAL/PLATELET
Basophils Absolute: 0.1 10*3/uL (ref 0–0.1)
Basophils Relative: 1 %
EOS ABS: 0.2 10*3/uL (ref 0–0.7)
Eosinophils Relative: 3 %
HEMATOCRIT: 40.2 % (ref 40.0–52.0)
HEMOGLOBIN: 13.8 g/dL (ref 13.0–18.0)
LYMPHS ABS: 1.9 10*3/uL (ref 1.0–3.6)
Lymphocytes Relative: 31 %
MCH: 31 pg (ref 26.0–34.0)
MCHC: 34.4 g/dL (ref 32.0–36.0)
MCV: 90.1 fL (ref 80.0–100.0)
Monocytes Absolute: 0.6 10*3/uL (ref 0.2–1.0)
Monocytes Relative: 10 %
NEUTROS ABS: 3.4 10*3/uL (ref 1.4–6.5)
NEUTROS PCT: 55 %
Platelets: 217 10*3/uL (ref 150–440)
RBC: 4.46 MIL/uL (ref 4.40–5.90)
RDW: 13.2 % (ref 11.5–14.5)
WBC: 6.1 10*3/uL (ref 3.8–10.6)

## 2016-10-23 MED ORDER — NITROFURANTOIN MONOHYD MACRO 100 MG PO CAPS: 100.0000 mg | ORAL_CAPSULE | Freq: Two times a day (BID) | ORAL | 0 refills | Status: AC

## 2016-10-23 NOTE — ED Notes (Signed)
Pt received a call from [place] that his urine culture grew a bacteria and needed a new antibiotic for bacteria. Patient has no c/o at this time. Pt wants his foley out if possible today

## 2016-10-23 NOTE — ED Provider Notes (Signed)
[facility] Emergency Department Provider Note  ____________________________________________  Time seen: Approximately 5:11 PM  I have reviewed the triage vital signs and the nursing notes.   HISTORY  Chief Complaint Follow-up   HPI David Glover is a 71 y.o. male with h/o BPH seen 3 days ago for early pyeloand dc on keflex and folye catheter due to retention who presents after receiving a phone call from pharmacy about urine culture. His culture grew enterococcus which is not sensitive to cephalosporins. Patient reports that he feels markedly improved, no longer having dysuria or flank pain like he was before. No abdominal pain, no nausea or vomiting, no fever or chills. He is complaining of discomfort in having the Foley catheter and wishes that to be removed if it safe.  Past Medical History:  Diagnosis Date  . Arthritis   . Diverticulitis   . GERD (gastroesophageal reflux disease)   . Hyperlipidemia   . Hypertension     Patient Active Problem List   Diagnosis Date Noted  . Elevated bilirubin 04/12/2016  . Right hand pain 03/23/2016  . Encounter for general adult medical examination with abnormal findings 02/11/2016  . Joint pain 02/11/2016  . Enlarged salivary gland 02/11/2016  . Rib pain on left side 08/11/2015  . Elevated blood pressure reading 04/14/2015  . BPH (benign prostatic hyperplasia) 04/14/2015  . GERD (gastroesophageal reflux disease) 04/14/2015    Past Surgical History:  Procedure Laterality Date  . HERNIA REPAIR  2016   Bilateral inguinal, femoral  . KNEE ARTHROSCOPY W/ MENISCAL REPAIR  2010  . NISSEN FUNDOPLICATION  1093  . PARATHYROIDECTOMY  2015  . [place]    Prior to Admission medications   Medication Sig Start Date End Date Taking? Authorizing Provider  nitrofurantoin, macrocrystal-monohydrate, (MACROBID) 100 MG capsule Take 1 capsule (100 mg total) by mouth 2 (two) times daily. 10/23/16  10/30/16  Rudene Re, MD  tamsulosin (FLOMAX) 0.4 MG CAPS capsule Take 1 capsule (0.4 mg total) by mouth daily. 10/20/16   Rudene Re, MD    Allergies Penicillins  Family History  Problem Relation Age of Onset  . Alcoholism Unknown   . Arthritis Unknown   . Lung cancer Unknown   . Heart disease Unknown     Social History Social History  Substance Use Topics  . Smoking status: Former Research scientist (life sciences)  . Smokeless tobacco: Never Used  . Alcohol use No    Review of Systems  Constitutional: Negative for fever. Eyes: Negative for visual changes. ENT: Negative for sore throat. Neck: No neck pain  Cardiovascular: Negative for chest pain. Respiratory: Negative for shortness of breath. Gastrointestinal: Negative for abdominal pain, vomiting or diarrhea. Genitourinary: Negative for dysuria. Musculoskeletal: Negative for back pain. Skin: Negative for rash. Neurological: Negative for headaches, weakness or numbness. Psych: No SI or HI  ____________________________________________   PHYSICAL EXAM:  VITAL SIGNS: ED Triage Vitals  Enc Vitals Group     BP 10/23/16 1641 (!) 184/115     Pulse Rate 10/23/16 1641 75     Resp 10/23/16 1641 20     Temp --      Temp src --      SpO2 10/23/16 1641 95 %     Weight 10/23/16 1642 204 lb (92.5 kg)     Height 10/23/16 1642 6' (1.829 m)     Head Circumference --      Peak Flow --      Pain Score 10/23/16 1641  0     Pain Loc --      Pain Edu? --      Excl. in [place]? --     Constitutional: Alert and oriented. Well appearing and in no apparent distress. HEENT:      Head: Normocephalic and atraumatic.         Eyes: Conjunctivae are normal. Sclera is non-icteric.       Mouth/Throat: Mucous membranes are moist.       Neck: Supple with no signs of meningismus. Cardiovascular: Regular rate and rhythm. No murmurs, gallops, or rubs. 2+ symmetrical distal pulses are present in all extremities. No JVD. Respiratory: Normal respiratory  effort. Lungs are clear to auscultation bilaterally. No wheezes, crackles, or rhonchi.  Gastrointestinal: Soft, non tender, and non distended with positive bowel sounds. No rebound or guarding. Genitourinary: No CVA tenderness. Musculoskeletal: Nontender with normal range of motion in all extremities. No edema, cyanosis, or erythema of extremities. Neurologic: Normal speech and language. Face is symmetric. Moving all extremities. No gross focal neurologic deficits are appreciated. Skin: Skin is warm, dry and intact. No rash noted. Psychiatric: Mood and affect are normal. Speech and behavior are normal.  ____________________________________________   LABS (all labs ordered are listed, but only abnormal results are displayed)  Labs Reviewed  URINALYSIS, COMPLETE (UACMP) WITH MICROSCOPIC - Abnormal; Notable for the following:       Result Value   Color, Urine STRAW (*)    APPearance CLEAR (*)    Specific Gravity, Urine 1.004 (*)    Hgb urine dipstick MODERATE (*)    Leukocytes, UA TRACE (*)    Bacteria, UA RARE (*)    All other components within normal limits  URINE CULTURE  CBC WITH DIFFERENTIAL/PLATELET  BASIC METABOLIC PANEL   ____________________________________________  EKG  none  ____________________________________________  RADIOLOGY  none  ____________________________________________   PROCEDURES  Procedure(s) performed: None Procedures Critical Care performed:  None ____________________________________________   INITIAL IMPRESSION / ASSESSMENT AND PLAN / ED COURSE   71 y.o. male with h/o BPH seen 3 days ago for early pyeloand dc on keflex and folye catheter due to retention who presents after receiving a phone call from pharmacy about urine culture growing enterococcus nonsuccess to cephalosporins. However patient does report significant improvement of his symptoms no longer having flank pain or dysuria. He reports that the Foley is draining yellow urine and  that is not cloudy. He has no systemic symptoms. His vitals are within normal limits. I will repeat CBC since his white count was elevated at 15 three days ago and repeat UA and culture. If infection seems to be improved, I will remove the foley and switch patient to macrobid per sensitivities seen in his last culture. If not, will start patient on vanc and admit.    _________________________ 6:12 PM on 10/23/2016 -----------------------------------------  White count is normalized, creatinine remained stable. UA showing persistent infection. Patient has an allergy to penicillin before Macrobid is the only oral antibiotic available for enterococci infection. Recommended the patient continues with the Foley catheter, Macrobid for 7 days, and follow-up with PCP or urology in 7 days for removal of the Foley catheter. Patient is concerned that he was unable to get an appointment with urology and to 3 weeks from now and he does not wish to keep the catheter for this long. I told him that if he is unable to have the Foley removed and he cannot return to the emergency room to have that done. Discussed  return precautions for signs of worsening infection.  Pertinent labs & imaging results that were available during my care of the patient were reviewed by me and considered in my medical decision making (see chart for details).    ____________________________________________   FINAL CLINICAL IMPRESSION(S) / ED DIAGNOSES  Final diagnoses:  Acute cystitis with hematuria      NEW MEDICATIONS STARTED DURING THIS VISIT:  New Prescriptions   NITROFURANTOIN, MACROCRYSTAL-MONOHYDRATE, (MACROBID) 100 MG CAPSULE    Take 1 capsule (100 mg total) by mouth 2 (two) times daily.     Note:  This document was prepared using Dragon voice recognition software and may include unintentional dictation errors.    Alfred Levins, [place], MD 10/23/16 [phone]

## 2016-10-23 NOTE — ED Triage Notes (Signed)
Pt seen here on 8/23 and dx with pyleonephritis. Pt states he is feeling better and received a call from the pharmacy that his culture grew back a different organism.  Pt states he is not having any other sxs and states he may need to DC the atbx if it is not the right medication for his infection and start a new one.  Pt also states he wants to get his foley out because he cannot get to a urologist until Sept 4.   Wife asked if he could just get a CBC to check for infection if he needs to continue atbx or not.

## 2016-10-23 NOTE — Progress Notes (Signed)
Patient seen for urinary discomfort and abdominal pain. Prescribed Cephlaxein for UTI.   Urine cultures resulted as Enterococcus faecalis  Spoke with patient's wife and patient's wife stated patient no longer has an urinary symptoms, no fever or abdominal discomfort.  Spoke with MD Cinda Quest about this and  MD [facility] agreed in no need to change abxs.  Larene Beach, PharmD

## 2016-10-25 LAB — URINE CULTURE

## 2016-11-01 ENCOUNTER — Ambulatory Visit: Payer: Self-pay

## 2016-11-10 ENCOUNTER — Ambulatory Visit: Payer: Self-pay

## 2017-02-10 ENCOUNTER — Ambulatory Visit: Payer: Medicare Other | Admitting: Family Medicine

## 2017-04-13 DIAGNOSIS — R69 Illness, unspecified: Secondary | ICD-10-CM | POA: Diagnosis not present

## 2017-05-16 DIAGNOSIS — R945 Abnormal results of liver function studies: Secondary | ICD-10-CM | POA: Diagnosis not present

## 2017-05-16 DIAGNOSIS — K22719 Barrett's esophagus with dysplasia, unspecified: Secondary | ICD-10-CM | POA: Diagnosis not present

## 2017-05-16 DIAGNOSIS — E7800 Pure hypercholesterolemia, unspecified: Secondary | ICD-10-CM | POA: Diagnosis not present

## 2017-05-16 DIAGNOSIS — I10 Essential (primary) hypertension: Secondary | ICD-10-CM | POA: Diagnosis not present

## 2017-05-16 DIAGNOSIS — N400 Enlarged prostate without lower urinary tract symptoms: Secondary | ICD-10-CM | POA: Diagnosis not present

## 2017-05-16 DIAGNOSIS — Z1283 Encounter for screening for malignant neoplasm of skin: Secondary | ICD-10-CM | POA: Diagnosis not present

## 2017-05-16 DIAGNOSIS — Z125 Encounter for screening for malignant neoplasm of prostate: Secondary | ICD-10-CM | POA: Diagnosis not present

## 2017-05-16 DIAGNOSIS — Z9889 Other specified postprocedural states: Secondary | ICD-10-CM | POA: Diagnosis not present

## 2017-05-16 DIAGNOSIS — K219 Gastro-esophageal reflux disease without esophagitis: Secondary | ICD-10-CM | POA: Diagnosis not present

## 2017-05-23 DIAGNOSIS — E7800 Pure hypercholesterolemia, unspecified: Secondary | ICD-10-CM | POA: Diagnosis not present

## 2017-05-23 DIAGNOSIS — I10 Essential (primary) hypertension: Secondary | ICD-10-CM | POA: Diagnosis not present

## 2017-05-23 DIAGNOSIS — R945 Abnormal results of liver function studies: Secondary | ICD-10-CM | POA: Diagnosis not present

## 2017-05-23 DIAGNOSIS — N400 Enlarged prostate without lower urinary tract symptoms: Secondary | ICD-10-CM | POA: Diagnosis not present

## 2017-05-23 DIAGNOSIS — Z125 Encounter for screening for malignant neoplasm of prostate: Secondary | ICD-10-CM | POA: Diagnosis not present

## 2017-05-23 DIAGNOSIS — K22719 Barrett's esophagus with dysplasia, unspecified: Secondary | ICD-10-CM | POA: Diagnosis not present

## 2017-06-13 DIAGNOSIS — E7800 Pure hypercholesterolemia, unspecified: Secondary | ICD-10-CM | POA: Diagnosis not present

## 2017-06-13 DIAGNOSIS — I10 Essential (primary) hypertension: Secondary | ICD-10-CM | POA: Diagnosis not present

## 2017-09-01 ENCOUNTER — Encounter: Payer: Self-pay | Admitting: Emergency Medicine

## 2017-09-01 ENCOUNTER — Emergency Department
Admission: EM | Admit: 2017-09-01 | Discharge: 2017-09-01 | Disposition: A | Discharge status: Home / Self Care | Payer: Medicare HMO | Attending: Emergency Medicine | Admitting: Emergency Medicine

## 2017-09-01 ENCOUNTER — Other Ambulatory Visit: Payer: Self-pay

## 2017-09-01 DIAGNOSIS — W312XXA Contact with powered woodworking and forming machines, initial encounter: Secondary | ICD-10-CM | POA: Diagnosis not present

## 2017-09-01 DIAGNOSIS — S61311A Laceration without foreign body of left index finger with damage to nail, initial encounter: Secondary | ICD-10-CM | POA: Diagnosis not present

## 2017-09-01 DIAGNOSIS — S6992XA Unspecified injury of left wrist, hand and finger(s), initial encounter: Secondary | ICD-10-CM | POA: Diagnosis present

## 2017-09-01 DIAGNOSIS — I10 Essential (primary) hypertension: Secondary | ICD-10-CM | POA: Diagnosis not present

## 2017-09-01 DIAGNOSIS — Y9389 Activity, other specified: Secondary | ICD-10-CM | POA: Insufficient documentation

## 2017-09-01 DIAGNOSIS — Z87891 Personal history of nicotine dependence: Secondary | ICD-10-CM | POA: Diagnosis not present

## 2017-09-01 DIAGNOSIS — Y998 Other external cause status: Secondary | ICD-10-CM | POA: Diagnosis not present

## 2017-09-01 DIAGNOSIS — Y929 Unspecified place or not applicable: Secondary | ICD-10-CM | POA: Diagnosis not present

## 2017-09-01 DIAGNOSIS — S61319A Laceration without foreign body of unspecified finger with damage to nail, initial encounter: Secondary | ICD-10-CM

## 2017-09-01 NOTE — ED Triage Notes (Signed)
Pt comes into the ED via POV c/o left index laceration after hitting it with a table saw.  Patient has all bleeding under control at this time.  Patient in NAD at this time with even and unlabored respirations.

## 2017-09-01 NOTE — Discharge Instructions (Signed)
You have shaved the nailbed. Keep the wound clean, dry, and covered. Follow-up with your provider for any ongoing concerns.

## 2017-09-01 NOTE — ED Provider Notes (Signed)
[facility] Emergency Department Provider Note ____________________________________________  Time seen: 1633  I have reviewed the triage vital signs and the nursing notes.  HISTORY  Chief Complaint  Finger Injury  HPI David Glover is a 72 y.o. male presents to the ED for evaluation of an accidental laceration to the left index finger. The patient describes hitting his finger against the table saw blade, the blade kicked his finger back, resulting in a shaving of the ulnar aspect of the nailbed. He denies any other injury at this time. He reports a current tetanus status.  Past Medical History:  Diagnosis Date  . Arthritis   . Diverticulitis   . GERD (gastroesophageal reflux disease)   . Hyperlipidemia   . Hypertension     Patient Active Problem List   Diagnosis Date Noted  . Elevated bilirubin 04/12/2016  . Right hand pain 03/23/2016  . Encounter for general adult medical examination with abnormal findings 02/11/2016  . Joint pain 02/11/2016  . Enlarged salivary gland 02/11/2016  . Rib pain on left side 08/11/2015  . Elevated blood pressure reading 04/14/2015  . BPH (benign prostatic hyperplasia) 04/14/2015  . GERD (gastroesophageal reflux disease) 04/14/2015    Past Surgical History:  Procedure Laterality Date  . HERNIA REPAIR  2016   Bilateral inguinal, femoral  . KNEE ARTHROSCOPY W/ MENISCAL REPAIR  2010  . NISSEN FUNDOPLICATION  7062  . PARATHYROIDECTOMY  2015  . [place]    Prior to Admission medications   Medication Sig Start Date End Date Taking? Authorizing Provider  tamsulosin (FLOMAX) 0.4 MG CAPS capsule Take 1 capsule (0.4 mg total) by mouth daily. 10/20/16   Rudene Re, MD    Allergies Penicillins  Family History  Problem Relation Age of Onset  . Alcoholism Unknown   . Arthritis Unknown   . Lung cancer Unknown   . Heart disease Unknown     Social History Social History   Tobacco Use   . Smoking status: Former Research scientist (life sciences)  . Smokeless tobacco: Never Used  Substance Use Topics  . Alcohol use: No    Alcohol/week: 0.0 oz  . Drug use: No    Review of Systems  Constitutional: Negative for fever. Cardiovascular: Negative for chest pain. Respiratory: Negative for shortness of breath. Musculoskeletal: Negative for back pain. Skin: Negative for rash. Left index finger laceration.  Neurological: Negative for headaches, focal weakness or numbness. ____________________________________________  PHYSICAL EXAM:  VITAL SIGNS: ED Triage Vitals  Enc Vitals Group     BP 09/01/17 1508 (!) 149/95     Pulse Rate 09/01/17 1508 (!) 108     Resp 09/01/17 1508 16     Temp 09/01/17 1508 97.7 F (36.5 C)     Temp Source 09/01/17 1508 Oral     SpO2 09/01/17 1508 95 %     Weight 09/01/17 1509 205 lb (93 kg)     Height 09/01/17 1509 6' (1.829 m)     Head Circumference --      Peak Flow --      Pain Score 09/01/17 1509 2     Pain Loc --      Pain Edu? --      Excl. in [place]? --     Constitutional: Alert and oriented. Well appearing and in no distress. Head: Normocephalic and atraumatic. Cardiovascular: Normal rate, regular rhythm. Normal distal pulses. Respiratory: Normal respiratory effort. Musculoskeletal: Normal composite fist on the left hand.  Left index finger reveals  a 50% shave laceration over the nailbed.  No nail avulsion is appreciated.  No deformity to the DIP or distal tuft is appreciated.  Nontender with normal range of motion in all extremities.  Neurologic:  Normal gait without ataxia. Normal speech and language. No gross focal neurologic deficits are appreciated. Skin:  Skin is warm, dry and intact. No rash noted. ____________________________________________  PROCEDURES  Procedures Wound cleansed Vaseline-soaked gauze 2x2 Tube gauze [place] splint ____________________________________________  INITIAL IMPRESSION / ASSESSMENT AND PLAN / ED COURSE  Patient  with ED evaluation of an accidental laceration to the left index finger.  Patient sustained a partial shave laceration over the nailbed.  The wound is cleansed and dressed with nonstick dressing.  Patient and his wife are discharged with wound care instructions and supplies.  Patient will follow with primary provider return as needed. ____________________________________________  FINAL CLINICAL IMPRESSION(S) / ED DIAGNOSES  Final diagnoses:  Laceration of finger nail bed, initial encounter      Melvenia Needles, PA-C 09/01/17 1810    Carrie Mew, MD 09/01/17 2046

## 2017-12-13 DIAGNOSIS — R69 Illness, unspecified: Secondary | ICD-10-CM | POA: Diagnosis not present

## 2017-12-14 DIAGNOSIS — R69 Illness, unspecified: Secondary | ICD-10-CM | POA: Diagnosis not present

## 2017-12-14 DIAGNOSIS — H524 Presbyopia: Secondary | ICD-10-CM | POA: Diagnosis not present

## 2017-12-14 DIAGNOSIS — H5203 Hypermetropia, bilateral: Secondary | ICD-10-CM | POA: Diagnosis not present

## 2017-12-14 DIAGNOSIS — I10 Essential (primary) hypertension: Secondary | ICD-10-CM | POA: Diagnosis not present

## 2017-12-14 DIAGNOSIS — H52223 Regular astigmatism, bilateral: Secondary | ICD-10-CM | POA: Diagnosis not present

## 2018-10-10 IMAGING — CT CT RENAL STONE PROTOCOL
2 of 4 series · 16 of 46 positions shown, 18 images · non-contrast
Comparison: None.

CLINICAL DATA: Bilateral flank pain. Burning sensation when
urinating.

EXAM:
CT ABDOMEN AND PELVIS WITHOUT CONTRAST
TECHNIQUE: Multidetector CT imaging of the abdomen and pelvis was performed
following the standard protocol without IV contrast.

[Series 2: stone full standard · axial · 0.78mm/px · z∈[-769,-334]mm · 13 of 97 slices shown, 15 images]
[im 5/97  soft-tissue]
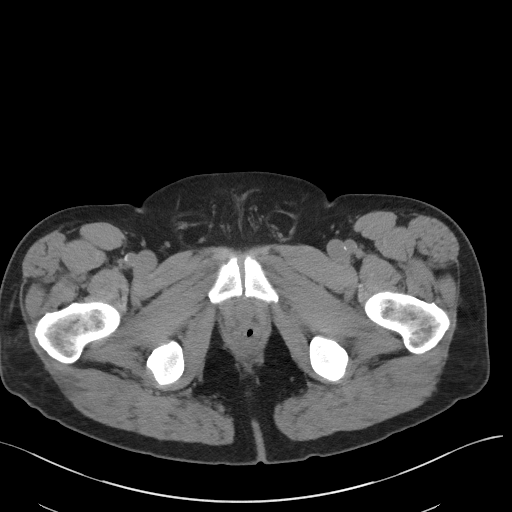
[im 5/97  bone]
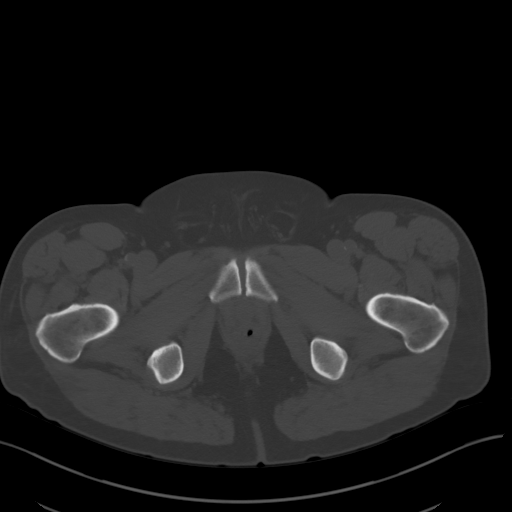
[im 13/97  soft-tissue]
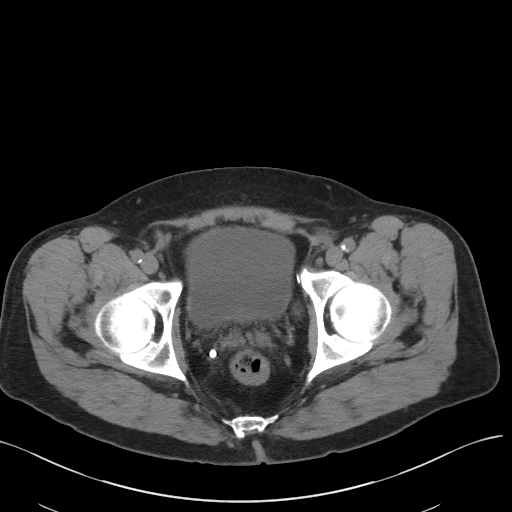
[im 21/97  soft-tissue]
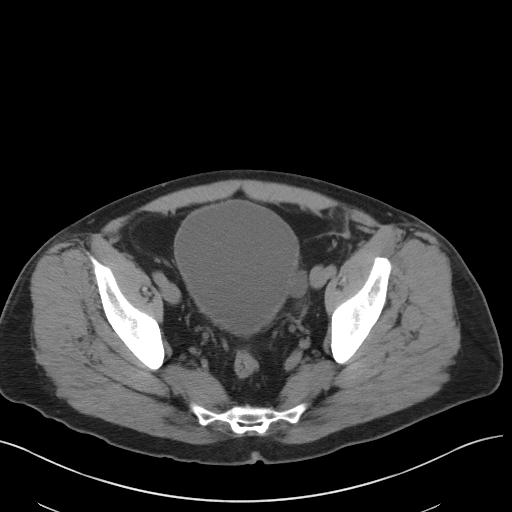
[im 26/97  soft-tissue]
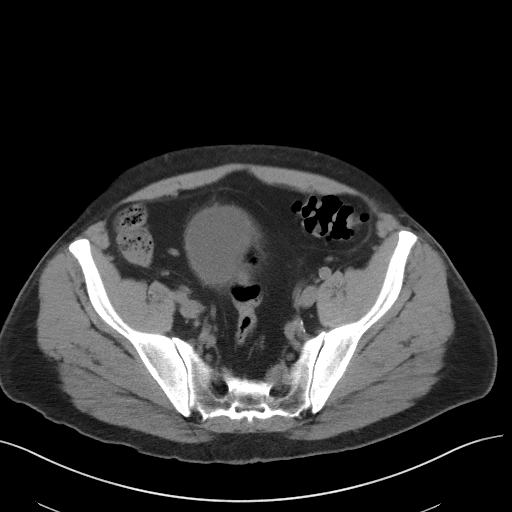
[im 34/97  soft-tissue]
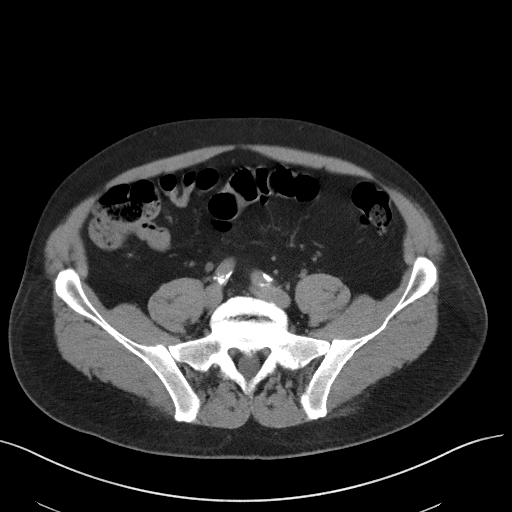
[im 42/97  soft-tissue]
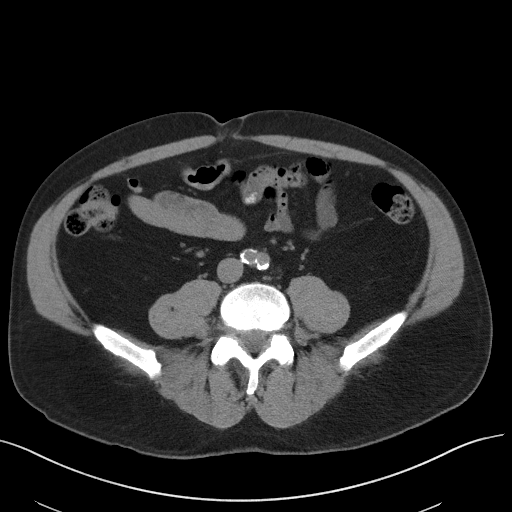
[im 51/97  soft-tissue]
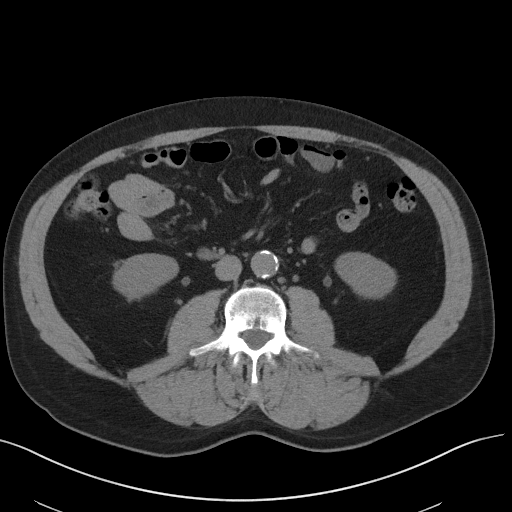
[im 55/97  soft-tissue]
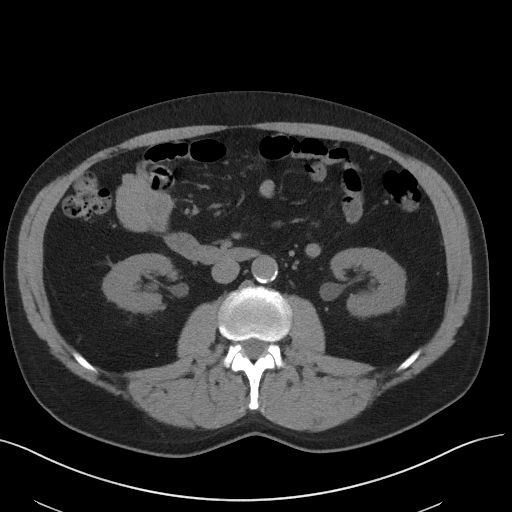
[im 63/97  soft-tissue]
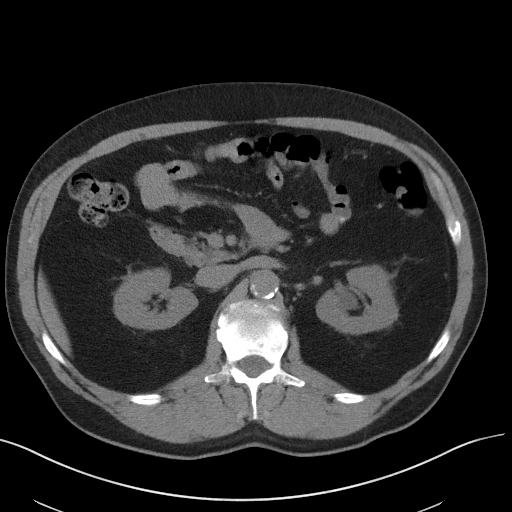
[im 63/97  bone]
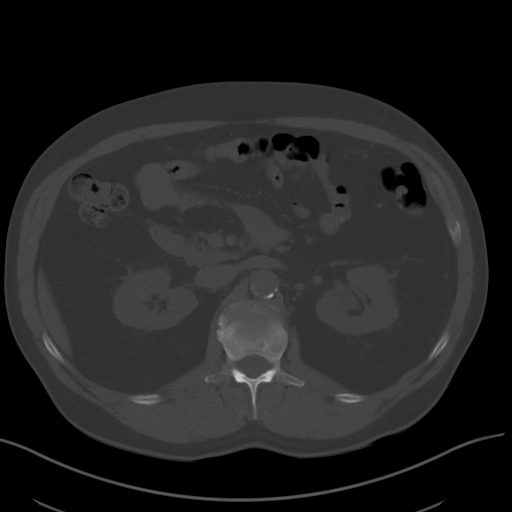
[im 71/97  soft-tissue]
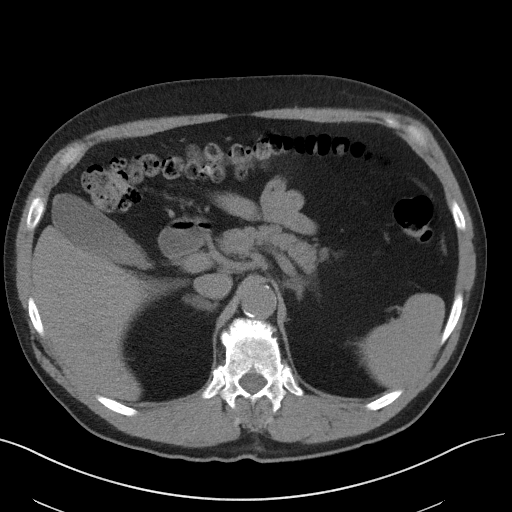
[im 76/97  soft-tissue]
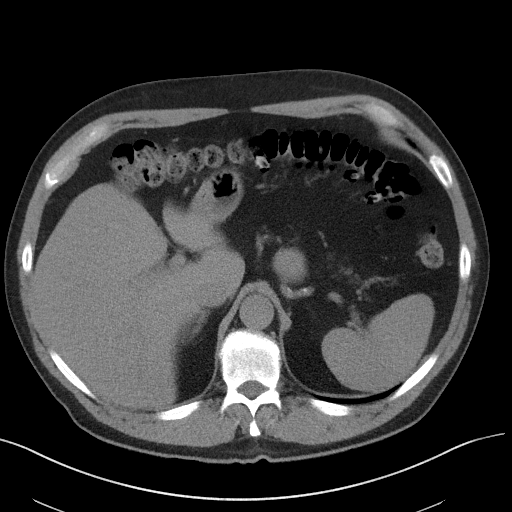
[im 84/97  soft-tissue]
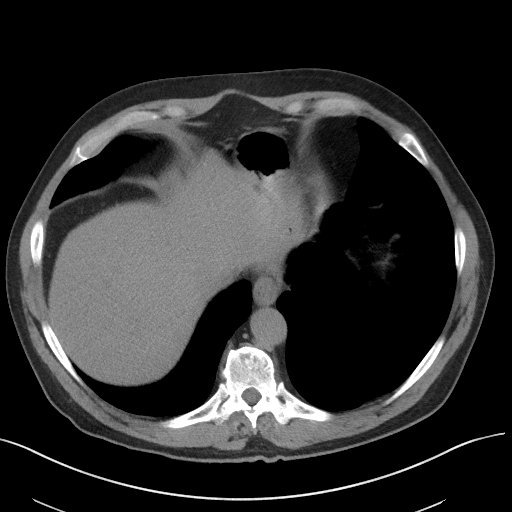
[im 92/97  soft-tissue]
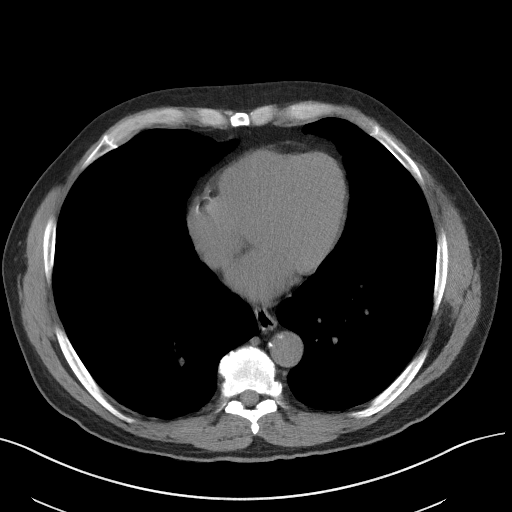

[Series 5: coronal · coronal · 0.81mm/px · 3 of 154 slices shown]
[im 52/154  soft-tissue]
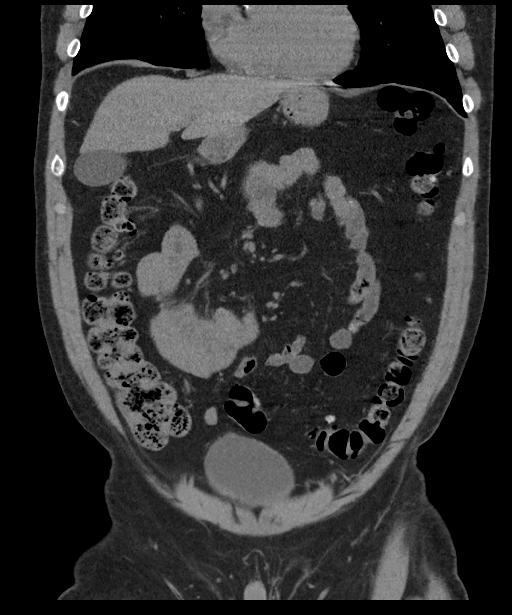
[im 69/154  soft-tissue]
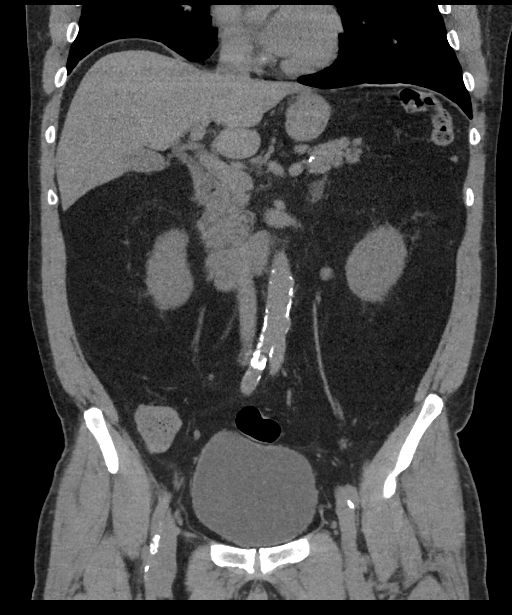
[im 86/154  soft-tissue]
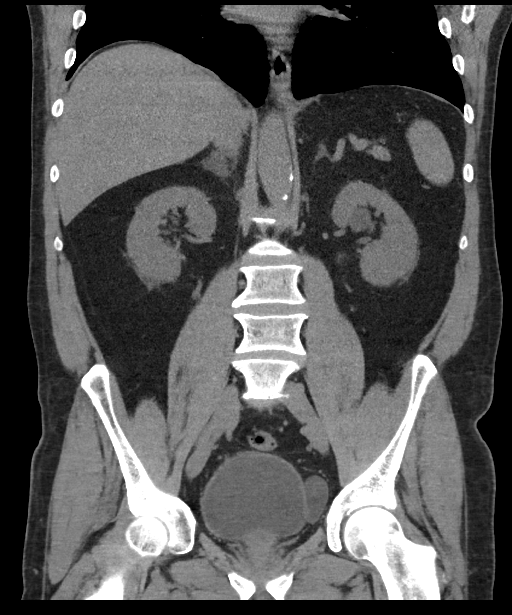

[16 of 46 positions shown; findings below may reference images not displayed]

FINDINGS: Lower chest: No acute abnormality.

Hepatobiliary: No focal liver abnormality is seen. No gallstones,
gallbladder wall thickening, or biliary dilatation.

Pancreas: Unremarkable. No pancreatic ductal dilatation or
surrounding inflammatory changes.

Spleen: Normal in size without focal abnormality.

Adrenals/Urinary Tract: Bilateral adrenal adenomas measuring up to
13 mm. No renal or ureteral calculi. No hydronephrosis. The bladder
is mildly distended.

Stomach/Bowel: Stomach is within normal limits. Appendix appears
normal. No evidence of bowel wall thickening, distention, or
inflammatory changes. There are few left-sided colonic diverticula.

Vascular/Lymphatic: Aortic atherosclerosis. No enlarged abdominal or
pelvic lymph nodes. In the left pelvic sidewall, there are two
well-defined, oval fluid density lesions measuring 2.3 x 1.2 cm and
3.1 x 2.3 cm. These lesions lie adjacent to the bladder, but do not
clearly arise from it.

Reproductive: Prostate is unremarkable.

Other: Small fat containing umbilical and bilateral inguinal
hernias. No ascites.

Musculoskeletal: No acute or significant osseous findings.
IMPRESSION: 1. No acute intra- abdominal process to explain the patient's pain.
No renal or ureteral calculi. No hydronephrosis.
2. Two well-defined oval fluid density lesions in the left pelvic
sidewall appear separate from the bladder and likely represent
lymphoceles, less likely bladder diverticula.
3.  Aortic atherosclerosis ([id number]).
4. Bilateral adrenal adenomas.

## 2019-03-22 ENCOUNTER — Ambulatory Visit: Payer: Medicare HMO | Attending: Internal Medicine

## 2019-03-22 DIAGNOSIS — Z23 Encounter for immunization: Secondary | ICD-10-CM

## 2019-03-22 NOTE — Progress Notes (Signed)
   Covid-19 Vaccination Clinic  Name:  David Glover    MRN: [id number] DOB: 1946-01-06  03/22/2019  Mr. Rohlman was observed post Covid-19 immunization for 15 minutes without incidence. He was provided with Vaccine Information Sheet and instruction to access the V-Safe system.   Mr. Dufour was instructed to call 911 with any severe reactions post vaccine: Marland Kitchen Difficulty breathing  . Swelling of your face and throat  . A fast heartbeat  . A bad rash all over your body  . Dizziness and weakness    Immunizations Administered    Name Date Dose VIS Date Route   Pfizer COVID-19 Vaccine 03/22/2019 12:06 PM 0.3 mL 02/08/2019 Intramuscular   Manufacturer: [place]   Lot: [id number]   [place]: [id number]

## 2019-04-10 ENCOUNTER — Ambulatory Visit: Payer: Medicare HMO | Attending: Internal Medicine

## 2019-04-10 DIAGNOSIS — Z23 Encounter for immunization: Secondary | ICD-10-CM | POA: Insufficient documentation

## 2019-04-10 NOTE — Progress Notes (Signed)
   Covid-19 Vaccination Clinic  Name:  David Glover    MRN: [id number] DOB: 10-22-1945  04/10/2019  Mr. Broderson was observed post Covid-19 immunization for 15 minutes without incidence. He was provided with Vaccine Information Sheet and instruction to access the V-Safe system.   Mr. Vernet was instructed to call 911 with any severe reactions post vaccine: Marland Kitchen Difficulty breathing  . Swelling of your face and throat  . A fast heartbeat  . A bad rash all over your body  . Dizziness and weakness    Immunizations Administered    Name Date Dose VIS Date Route   Pfizer COVID-19 Vaccine 04/10/2019 11:14 AM 0.3 mL 02/08/2019 Intramuscular   Manufacturer: [employer], [employer]   Lot: [id number]   [place]: [id number]
